# Patient Record
Sex: Female | Born: 1965 | Race: White | Hispanic: No | Marital: Married | Smoking: Current every day smoker
Health system: Southern US, Community
[De-identification: ages and names within clinical notes are randomized; demographics above are authoritative.]

## PROBLEM LIST (undated history)

## (undated) DIAGNOSIS — Z780 Asymptomatic menopausal state: Secondary | ICD-10-CM

## (undated) HISTORY — DX: Asymptomatic menopausal state: Z78.0

---

## 2011-04-14 ENCOUNTER — Other Ambulatory Visit: Payer: Self-pay | Admitting: Obstetrics and Gynecology

## 2011-04-14 DIAGNOSIS — Z1231 Encounter for screening mammogram for malignant neoplasm of breast: Secondary | ICD-10-CM

## 2011-05-12 ENCOUNTER — Ambulatory Visit
Admission: RE | Admit: 2011-05-12 | Discharge: 2011-05-12 | Disposition: A | Discharge status: Home / Self Care | Payer: Managed Care, Other (non HMO) | Source: Ambulatory Visit | Attending: Obstetrics and Gynecology | Admitting: Obstetrics and Gynecology

## 2011-05-12 DIAGNOSIS — Z1231 Encounter for screening mammogram for malignant neoplasm of breast: Secondary | ICD-10-CM

## 2012-04-23 ENCOUNTER — Other Ambulatory Visit: Payer: Self-pay | Admitting: Obstetrics and Gynecology

## 2012-04-23 DIAGNOSIS — Z1231 Encounter for screening mammogram for malignant neoplasm of breast: Secondary | ICD-10-CM

## 2012-05-30 ENCOUNTER — Ambulatory Visit
Admission: RE | Admit: 2012-05-30 | Discharge: 2012-05-30 | Disposition: A | Discharge status: Home / Self Care | Payer: Managed Care, Other (non HMO) | Source: Ambulatory Visit | Attending: Obstetrics and Gynecology | Admitting: Obstetrics and Gynecology

## 2012-05-30 DIAGNOSIS — Z1231 Encounter for screening mammogram for malignant neoplasm of breast: Secondary | ICD-10-CM

## 2012-08-26 DIAGNOSIS — M545 Low back pain, unspecified: Secondary | ICD-10-CM | POA: Insufficient documentation

## 2013-04-29 ENCOUNTER — Other Ambulatory Visit: Payer: Self-pay

## 2013-04-29 DIAGNOSIS — Z1231 Encounter for screening mammogram for malignant neoplasm of breast: Secondary | ICD-10-CM

## 2013-05-06 ENCOUNTER — Encounter: Payer: Self-pay | Admitting: Podiatrist

## 2013-05-06 ENCOUNTER — Ambulatory Visit (INDEPENDENT_AMBULATORY_CARE_PROVIDER_SITE_OTHER): Payer: Managed Care, Other (non HMO) | Admitting: Podiatrist

## 2013-05-06 ENCOUNTER — Ambulatory Visit (INDEPENDENT_AMBULATORY_CARE_PROVIDER_SITE_OTHER): Payer: Managed Care, Other (non HMO)

## 2013-05-06 VITALS — BP 135/89 | HR 72 | Resp 18

## 2013-05-06 DIAGNOSIS — M722 Plantar fascial fibromatosis: Secondary | ICD-10-CM

## 2013-05-06 DIAGNOSIS — M7751 Other enthesopathy of right foot: Secondary | ICD-10-CM

## 2013-05-06 DIAGNOSIS — M7750 Other enthesopathy of unspecified foot: Secondary | ICD-10-CM

## 2013-05-06 DIAGNOSIS — M79609 Pain in unspecified limb: Secondary | ICD-10-CM

## 2013-05-06 DIAGNOSIS — M25571 Pain in right ankle and joints of right foot: Secondary | ICD-10-CM

## 2013-05-06 MED ORDER — TRIAMCINOLONE ACETONIDE 40 MG/ML IJ SUSP
20.0000 mg | Freq: Once | INTRAMUSCULAR | Status: DC
  Administered 2013-05-06: 20 mg

## 2013-05-06 MED ORDER — TRIAMCINOLONE ACETONIDE 10 MG/ML IJ SUSP
10.0000 mg | Freq: Once | INTRAMUSCULAR | Status: AC
  Administered 2013-05-06: 10 mg

## 2013-05-06 NOTE — Patient Instructions (Signed)
Call if the injection doesn't resolve the pain-  You may need a second one.  Wear the ankle brace for 2-3 weeks during the day.  After that, you may wear it as needed

## 2013-05-06 NOTE — Progress Notes (Signed)
   Subjective:    Patient ID: Kim Shaw, female    DOB: 09-Aug-1965, 47 y.o.   MRN: [id number]  HPI patient presents today stating "my right foot hurts in the heel area and pain goes up into the ankle and it's been going on for 3 months"she states she was diagnosed with plantar fasciitis back in October. she got a shot in her heel which helped however now she is having pain along the lateral ankle of the right foot for the past 3 weeks. She states that sore and tender and uncomfortable when standing or walking. She denies any pain with first step in the morning or post-static dyskinesia type pain    Review of Systems  Constitutional: Negative.   HENT: Negative.   Respiratory: Negative.   Cardiovascular: Negative.   Gastrointestinal: Negative.   Endocrine: Negative.   Genitourinary: Negative.   Musculoskeletal:       Difficulty walking  Skin: Negative.   Allergic/Immunologic: Negative.   Neurological: Negative.   Hematological: Negative.   Psychiatric/Behavioral: Negative.        Objective:   Physical Exam GENERAL APPEARANCE: Alert, conversant. Appropriately groomed. No acute distress.  VASCULAR: Pedal pulses palpable and strong bilateral.  Capillary refill time is immediate to all digits,  Proximal to distal cooling it warm to warm.  Digital hair growth is present bilateral  NEUROLOGIC: sensation is intact epicritically and protectively to 5.07 monofilament at 5/5 sites bilateral.  Light touch is intact bilateral, vibratory sensation intact bilateral, achilles tendon reflex is intact bilateral.  MUSCULOSKELETAL: acceptable muscle strength, tone and stability bilateral.  Intrinsic muscluature intact bilateral.  Rectus appearance of foot and digits noted bilateral. Mild pain along the plantar medial aspect of the right heel is noted. Swelling and tenderness along the lateral aspect of the right ankle along the sinus tarsi region is present. Peroneal tendons appear to be intact. Some  discomfort along the distal insertion of the peroneus brevis noted.  DERMATOLOGIC: skin color, texture, and turger are within normal limits.  Swelling along the sinus tarsi region of the right ankle is noted. Fluid accumulation is seen clinically.    Assessment & Plan:  Capsulitis, sinus tarsi syndrome, plantar fasciitis Plan: Recommended an injection into the sinus tarsi region of the right ankle and the patient agreed. The skin was prepped with alcohol and an injection of Kenalog and Marcaine mixture was delivered into the sinus tarsi region of the right ankle. The patient tolerated this well I also dispensed and ankle brace for her use and she will be seen back for recheck. Encouraged continued use of ibuprofen anti-inflammatory and icing.  Marlowe Aschoff DPM

## 2013-06-02 ENCOUNTER — Ambulatory Visit: Payer: Managed Care, Other (non HMO)

## 2013-06-19 ENCOUNTER — Ambulatory Visit
Admission: RE | Admit: 2013-06-19 | Discharge: 2013-06-19 | Disposition: A | Discharge status: Home / Self Care | Payer: Managed Care, Other (non HMO) | Source: Ambulatory Visit

## 2013-06-19 DIAGNOSIS — Z1231 Encounter for screening mammogram for malignant neoplasm of breast: Secondary | ICD-10-CM

## 2013-10-07 ENCOUNTER — Ambulatory Visit (INDEPENDENT_AMBULATORY_CARE_PROVIDER_SITE_OTHER): Payer: Managed Care, Other (non HMO) | Admitting: Podiatrist

## 2013-10-07 ENCOUNTER — Encounter: Payer: Self-pay | Admitting: Podiatrist

## 2013-10-07 VITALS — BP 123/69 | HR 63 | Resp 18

## 2013-10-07 DIAGNOSIS — M7750 Other enthesopathy of unspecified foot: Secondary | ICD-10-CM

## 2013-10-07 DIAGNOSIS — M25571 Pain in right ankle and joints of right foot: Secondary | ICD-10-CM

## 2013-10-07 NOTE — Progress Notes (Signed)
   Subjective: Kim Shaw presents today for continued pain on the right ankle. At the last visit I gave her an injection into the sinus tarsi which she states helped moderately. She's been trying to wear the ankle brace but states she still has pain. She points to the dorsal lateral aspect of the ankle at the sinus tarsi region of the area of maximal discomfort.  Objective: Pain at the sinus tarsi region is noted. Otherwise neurovascular status is unchanged. She has swelling along the lateral aspect of the fibula as well. The lateral ankle appears to be stable with a negative anterior drawer sign and no pain with lateral to medial pressure. No plantar fascial pain is elicited today. 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 Recommended continued use of ankle brace. If there is no improvement in 3 weeks we'll consider an MRI versus physical therapy. Also briefly discussed that to put her in a boot. She will consider her options.

## 2015-04-13 ENCOUNTER — Other Ambulatory Visit: Payer: Self-pay

## 2015-04-13 DIAGNOSIS — Z1231 Encounter for screening mammogram for malignant neoplasm of breast: Secondary | ICD-10-CM

## 2015-05-25 ENCOUNTER — Ambulatory Visit
Admission: RE | Admit: 2015-05-25 | Discharge: 2015-05-25 | Disposition: A | Discharge status: Home / Self Care | Payer: BLUE CROSS/BLUE SHIELD | Source: Ambulatory Visit

## 2015-05-25 DIAGNOSIS — Z1231 Encounter for screening mammogram for malignant neoplasm of breast: Secondary | ICD-10-CM

## 2016-01-12 ENCOUNTER — Other Ambulatory Visit: Payer: Self-pay | Admitting: Obstetrics and Gynecology

## 2016-06-13 ENCOUNTER — Other Ambulatory Visit: Payer: Self-pay | Admitting: Family Medicine

## 2016-06-13 DIAGNOSIS — Z1231 Encounter for screening mammogram for malignant neoplasm of breast: Secondary | ICD-10-CM

## 2016-07-06 ENCOUNTER — Ambulatory Visit
Admission: RE | Admit: 2016-07-06 | Discharge: 2016-07-06 | Disposition: A | Discharge status: Home / Self Care | Payer: BLUE CROSS/BLUE SHIELD | Source: Ambulatory Visit | Attending: Family Medicine | Admitting: Family Medicine

## 2016-07-06 DIAGNOSIS — Z1231 Encounter for screening mammogram for malignant neoplasm of breast: Secondary | ICD-10-CM

## 2016-07-10 ENCOUNTER — Other Ambulatory Visit: Payer: Self-pay | Admitting: Family Medicine

## 2016-07-10 DIAGNOSIS — R928 Other abnormal and inconclusive findings on diagnostic imaging of breast: Secondary | ICD-10-CM

## 2016-07-12 ENCOUNTER — Ambulatory Visit
Admission: RE | Admit: 2016-07-12 | Discharge: 2016-07-12 | Disposition: A | Discharge status: Home / Self Care | Payer: BLUE CROSS/BLUE SHIELD | Source: Ambulatory Visit | Attending: Family Medicine | Admitting: Family Medicine

## 2016-07-12 DIAGNOSIS — N6322 Unspecified lump in the left breast, upper inner quadrant: Secondary | ICD-10-CM | POA: Diagnosis not present

## 2016-07-12 DIAGNOSIS — R928 Other abnormal and inconclusive findings on diagnostic imaging of breast: Secondary | ICD-10-CM

## 2016-08-30 DIAGNOSIS — H66009 Acute suppurative otitis media without spontaneous rupture of ear drum, unspecified ear: Secondary | ICD-10-CM | POA: Diagnosis not present

## 2016-09-06 DIAGNOSIS — Z01419 Encounter for gynecological examination (general) (routine) without abnormal findings: Secondary | ICD-10-CM | POA: Diagnosis not present

## 2016-09-06 DIAGNOSIS — Z1211 Encounter for screening for malignant neoplasm of colon: Secondary | ICD-10-CM | POA: Diagnosis not present

## 2016-09-06 DIAGNOSIS — Z6829 Body mass index (BMI) 29.0-29.9, adult: Secondary | ICD-10-CM | POA: Diagnosis not present

## 2016-09-06 DIAGNOSIS — Z1389 Encounter for screening for other disorder: Secondary | ICD-10-CM | POA: Diagnosis not present

## 2016-09-07 DIAGNOSIS — Z01419 Encounter for gynecological examination (general) (routine) without abnormal findings: Secondary | ICD-10-CM | POA: Diagnosis not present

## 2017-04-10 DIAGNOSIS — Z6829 Body mass index (BMI) 29.0-29.9, adult: Secondary | ICD-10-CM | POA: Diagnosis not present

## 2017-04-10 DIAGNOSIS — Z720 Tobacco use: Secondary | ICD-10-CM | POA: Diagnosis not present

## 2017-04-10 DIAGNOSIS — R509 Fever, unspecified: Secondary | ICD-10-CM | POA: Diagnosis not present

## 2017-09-17 DIAGNOSIS — Z1339 Encounter for screening examination for other mental health and behavioral disorders: Secondary | ICD-10-CM | POA: Diagnosis not present

## 2017-09-17 DIAGNOSIS — Z13220 Encounter for screening for lipoid disorders: Secondary | ICD-10-CM | POA: Diagnosis not present

## 2017-09-17 DIAGNOSIS — Z0000 Encounter for general adult medical examination without abnormal findings: Secondary | ICD-10-CM | POA: Diagnosis not present

## 2017-09-17 DIAGNOSIS — Z6829 Body mass index (BMI) 29.0-29.9, adult: Secondary | ICD-10-CM | POA: Diagnosis not present

## 2017-09-17 DIAGNOSIS — Z1331 Encounter for screening for depression: Secondary | ICD-10-CM | POA: Diagnosis not present

## 2018-02-17 DIAGNOSIS — J029 Acute pharyngitis, unspecified: Secondary | ICD-10-CM | POA: Diagnosis not present

## 2018-02-17 DIAGNOSIS — M542 Cervicalgia: Secondary | ICD-10-CM | POA: Diagnosis not present

## 2018-02-17 DIAGNOSIS — J0100 Acute maxillary sinusitis, unspecified: Secondary | ICD-10-CM | POA: Diagnosis not present

## 2018-06-09 IMAGING — US ULTRASOUND LEFT BREAST LIMITED
1 series · 4 of 4 positions shown · non-contrast
Comparison: Previous exams including recent screening mammogram
dated 07/10/2016.

CLINICAL DATA: Patient returns today to evaluate a possible mass
and possible asymmetry within the left breast identified on recent
screening mammogram.

EXAM:
2D DIGITAL DIAGNOSTIC LEFT MAMMOGRAM WITH CAD AND ADJUNCT TOMO
ULTRASOUND LEFT BREAST

[Series 1: ultrasound left breast limited · 0.07mm/px · 4 of 4 slices shown]
[im 1/4]
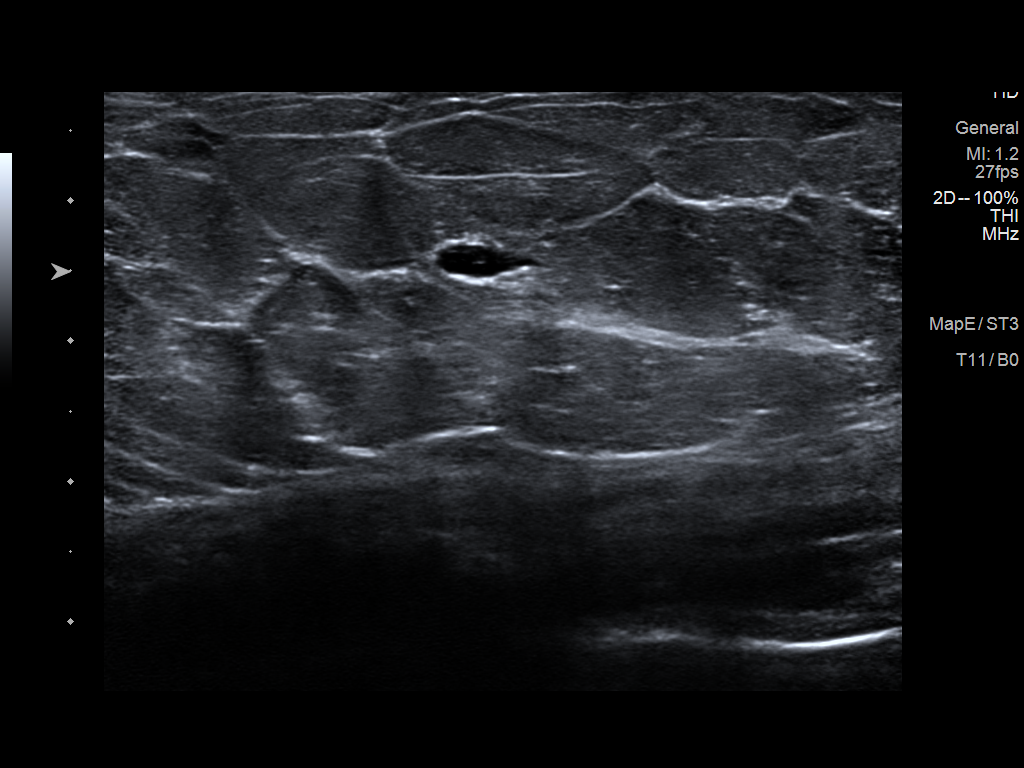
[im 2/4]
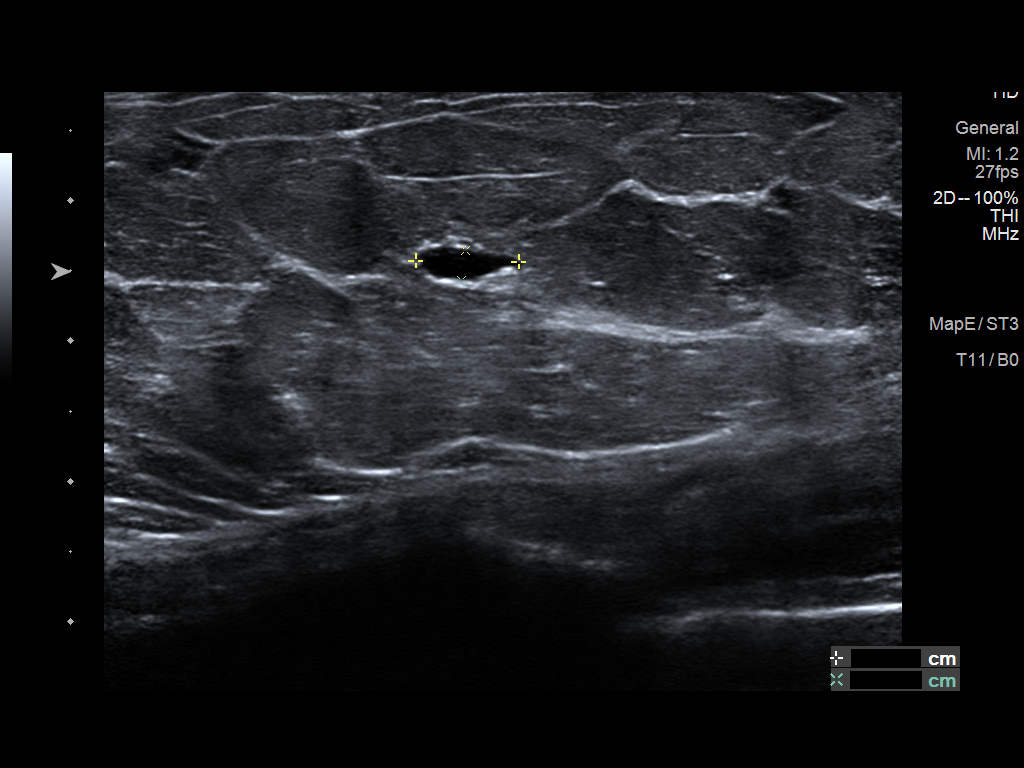
[im 3/4]
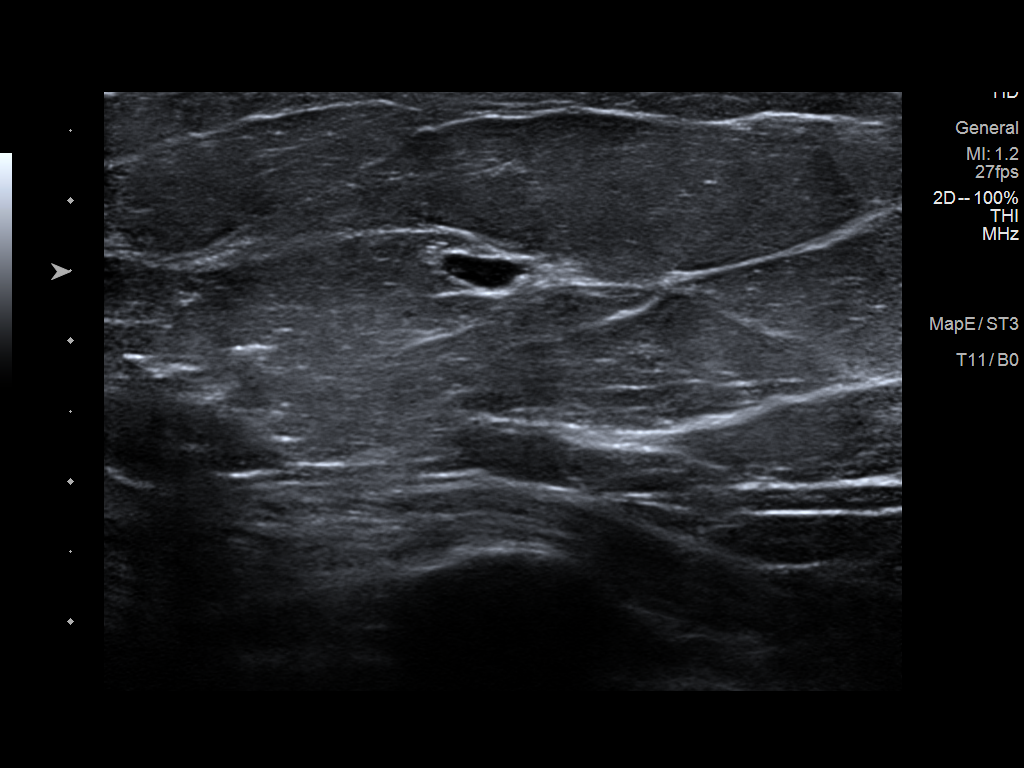
[im 4/4]
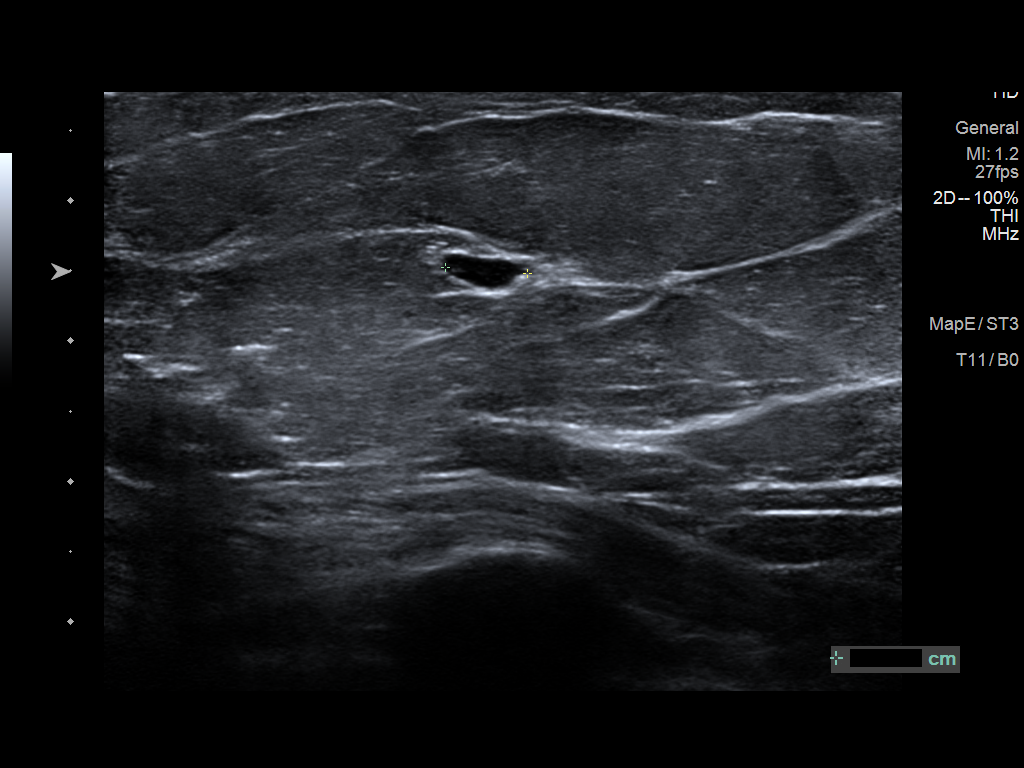

[4 of 4 positions shown; findings below may reference images not displayed]

ACR Breast Density Category c: The breast tissue is heterogeneously
dense, which may obscure small masses.
FINDINGS: On today's additional views with spot compression and 3D
tomosynthesis, the questioned asymmetry on the recent screening
mammogram, seen on the MLO projection only, is resolved indicating
superimposition of normal fibroglandular tissues.

An oval circumscribed low-density mass is confirmed within the left
breast at posterior depth, 11-12 o'clock axis region, measuring
approximately 6 mm greatest dimension.

Mammographic images were processed with CAD.

Targeted ultrasound is performed, showing a benign cyst within the
left breast at the 11 o'clock axis, 8 cm from the nipple, measuring
7 x 2 x 6 mm, corresponding to the mammographic finding. No
suspicious solid or cystic mass is identified by ultrasound.
IMPRESSION: No evidence of malignancy. Benign cyst within the left breast at the
11 o'clock axis corresponding to the mammographic finding.

Patient may return to routine annual bilateral screening mammogram
schedule.

RECOMMENDATION:
Screening mammogram in one year.(Code:U2-6-ONK)

I have discussed the findings and recommendations with the patient.
Results were also provided in writing at the conclusion of the
visit. If applicable, a reminder letter will be sent to the patient
regarding the next appointment.

BI-RADS CATEGORY  2: Benign.

## 2018-10-08 DIAGNOSIS — Z1331 Encounter for screening for depression: Secondary | ICD-10-CM | POA: Diagnosis not present

## 2018-10-08 DIAGNOSIS — Z0000 Encounter for general adult medical examination without abnormal findings: Secondary | ICD-10-CM | POA: Diagnosis not present

## 2018-10-08 DIAGNOSIS — Z6830 Body mass index (BMI) 30.0-30.9, adult: Secondary | ICD-10-CM | POA: Diagnosis not present

## 2018-10-27 DIAGNOSIS — Z1212 Encounter for screening for malignant neoplasm of rectum: Secondary | ICD-10-CM | POA: Diagnosis not present

## 2018-10-27 DIAGNOSIS — Z1211 Encounter for screening for malignant neoplasm of colon: Secondary | ICD-10-CM | POA: Diagnosis not present

## 2019-02-14 DIAGNOSIS — M7710 Lateral epicondylitis, unspecified elbow: Secondary | ICD-10-CM | POA: Diagnosis not present

## 2019-02-14 DIAGNOSIS — Z6830 Body mass index (BMI) 30.0-30.9, adult: Secondary | ICD-10-CM | POA: Diagnosis not present

## 2019-03-20 DIAGNOSIS — M7712 Lateral epicondylitis, left elbow: Secondary | ICD-10-CM | POA: Insufficient documentation

## 2019-04-10 DIAGNOSIS — M7712 Lateral epicondylitis, left elbow: Secondary | ICD-10-CM | POA: Diagnosis not present

## 2021-06-02 ENCOUNTER — Other Ambulatory Visit: Payer: Self-pay

## 2021-06-02 ENCOUNTER — Ambulatory Visit (INDEPENDENT_AMBULATORY_CARE_PROVIDER_SITE_OTHER): Payer: 59

## 2021-06-02 ENCOUNTER — Ambulatory Visit (INDEPENDENT_AMBULATORY_CARE_PROVIDER_SITE_OTHER): Payer: 59 | Admitting: Podiatry

## 2021-06-02 DIAGNOSIS — M7750 Other enthesopathy of unspecified foot: Secondary | ICD-10-CM

## 2021-06-02 DIAGNOSIS — M79672 Pain in left foot: Secondary | ICD-10-CM | POA: Diagnosis not present

## 2021-06-02 DIAGNOSIS — M722 Plantar fascial fibromatosis: Secondary | ICD-10-CM | POA: Diagnosis not present

## 2021-06-02 DIAGNOSIS — M7752 Other enthesopathy of left foot: Secondary | ICD-10-CM

## 2021-06-06 NOTE — Progress Notes (Signed)
°  Subjective:  Patient ID: Kim Shaw, female    DOB: 1965-12-07,  MRN: [id number]  No chief complaint on file.  56 y.o. female presents for left foot and ankle pain. Reports pain present for 3-4 weeks. Has been icing and elevating. Received a shot by her PCP which helped. Pain rated 8/10 when standing, but otherwise nothing at rest. Also complaoins of pain to the left ankle for 3-4 weeks, with burning.  Objective:  Physical Exam: warm, good capillary refill, no trophic changes or ulcerative lesions, normal DP and PT pulses, and normal sensory exam. Left Foot: tenderness to palpation medial calcaneal tuber, no pain with calcaneal squeeze, decreased ankle joint ROM, and +Silverskiold test   Radiographs: X-ray of the left foot: no evidence of calcaneal stress fracture, plantar calcaneal spur, posterior calcaneal spur, and Haglund deformity noted Assessment:   1. Plantar fasciitis   2. Pain of left heel   3. Tendonitis of ankle      Plan:  Patient was evaluated and treated and all questions answered.  Plantar Fasciitis -XR reviewed with patient -Educated patient on stretching and icing of the affected limb -Plantar fascial brace dispensed -Injection delivered to the plantar fascia of the left foot.  Procedure: Injection Tendon/Ligament Consent: Verbal consent obtained. Location: Left plantar fascia at the glabrous junction; medial approach. Skin Prep: Alcohol. Injectate: 1 cc 0.5% marcaine plain, 1 cc betamethasone acetate-betamethasone sodium phosphate Disposition: Patient tolerated procedure well. Injection site dressed with a band-aid.  No follow-ups on file.

## 2021-07-04 ENCOUNTER — Encounter: Payer: Self-pay | Admitting: Podiatry

## 2021-07-04 ENCOUNTER — Ambulatory Visit (INDEPENDENT_AMBULATORY_CARE_PROVIDER_SITE_OTHER): Payer: 59 | Admitting: Podiatry

## 2021-07-04 DIAGNOSIS — M722 Plantar fascial fibromatosis: Secondary | ICD-10-CM

## 2021-07-04 DIAGNOSIS — M79672 Pain in left foot: Secondary | ICD-10-CM

## 2021-07-04 MED ORDER — BETAMETHASONE SOD PHOS & ACET 6 (3-3) MG/ML IJ SUSP
6.0000 mg | Freq: Once | INTRAMUSCULAR | Status: AC
  Administered 2021-07-04: 6 mg

## 2021-07-04 NOTE — Progress Notes (Signed)
°  Subjective:  Patient ID: Kim Shaw, female    DOB: 02-01-1966,  MRN: [id number]  Chief Complaint  Patient presents with   Plantar Fasciitis    The brace made the foot worse and the injection helped a little on the left heel   56 y.o. female presents for left foot and ankle pain. Still having pain in the ankle as well as the heel. Injection helped for about 3-4 days. Objective:  Physical Exam: warm, good capillary refill, no trophic changes or ulcerative lesions, normal DP and PT pulses, and normal sensory exam. Left Foot: tenderness to palpation medial calcaneal tuber, no pain with calcaneal squeeze, decreased ankle joint ROM, and +Silverskiold test. TTP to the ATFl  Assessment:   1. Plantar fasciitis   2. Pain of left heel    Plan:  Patient was evaluated and treated and all questions answered.  Plantar Fasciitis -Injection #2 today -Dispense CAM boot for offloading of the plantar fascia and ATFL.  Procedure: Injection Tendon/Ligament Consent: Verbal consent obtained. Location: Left plantar fascia at the glabrous junction; medial approach. Skin Prep: Alcohol. Injectate: 1 cc 0.5% marcaine plain, 1 cc betamethasone acetate-betamethasone sodium phosphate Disposition: Patient tolerated procedure well. Injection site dressed with a band-aid.  Return in about 4 weeks (around 08/01/2021).

## 2021-08-01 ENCOUNTER — Ambulatory Visit: Payer: 59 | Admitting: Podiatry

## 2021-08-03 ENCOUNTER — Ambulatory Visit: Payer: 59 | Admitting: Sports Medicine

## 2021-08-10 ENCOUNTER — Ambulatory Visit: Payer: 59 | Admitting: Sports Medicine

## 2021-10-05 ENCOUNTER — Other Ambulatory Visit: Payer: Self-pay | Admitting: Family Medicine

## 2021-10-05 DIAGNOSIS — Z1231 Encounter for screening mammogram for malignant neoplasm of breast: Secondary | ICD-10-CM

## 2022-01-25 ENCOUNTER — Ambulatory Visit
Admission: RE | Admit: 2022-01-25 | Discharge: 2022-01-25 | Disposition: A | Discharge status: Home / Self Care | Payer: 59 | Source: Ambulatory Visit | Attending: Family Medicine | Admitting: Family Medicine

## 2022-01-25 DIAGNOSIS — Z1231 Encounter for screening mammogram for malignant neoplasm of breast: Secondary | ICD-10-CM

## 2022-12-25 ENCOUNTER — Other Ambulatory Visit: Payer: Self-pay | Admitting: Family Medicine

## 2022-12-25 DIAGNOSIS — Z1231 Encounter for screening mammogram for malignant neoplasm of breast: Secondary | ICD-10-CM

## 2023-02-07 ENCOUNTER — Ambulatory Visit
Admission: RE | Admit: 2023-02-07 | Discharge: 2023-02-07 | Disposition: A | Discharge status: Home / Self Care | Payer: 59 | Source: Ambulatory Visit | Attending: Family Medicine | Admitting: Family Medicine

## 2023-02-07 DIAGNOSIS — Z1231 Encounter for screening mammogram for malignant neoplasm of breast: Secondary | ICD-10-CM

## 2024-01-14 ENCOUNTER — Other Ambulatory Visit: Payer: Self-pay | Admitting: Family Medicine

## 2024-01-14 DIAGNOSIS — Z1231 Encounter for screening mammogram for malignant neoplasm of breast: Secondary | ICD-10-CM

## 2024-02-08 ENCOUNTER — Other Ambulatory Visit: Payer: Self-pay | Admitting: Family Medicine

## 2024-02-08 DIAGNOSIS — Z1231 Encounter for screening mammogram for malignant neoplasm of breast: Secondary | ICD-10-CM

## 2024-02-13 ENCOUNTER — Ambulatory Visit
Admission: RE | Admit: 2024-02-13 | Discharge: 2024-02-13 | Disposition: A | Discharge status: Home / Self Care | Payer: BLUE CROSS/BLUE SHIELD | Source: Ambulatory Visit | Attending: Family Medicine | Admitting: Family Medicine

## 2024-02-13 DIAGNOSIS — Z1231 Encounter for screening mammogram for malignant neoplasm of breast: Secondary | ICD-10-CM
